# Patient Record
Sex: Female | Born: 1960 | Race: White | Hispanic: No | Marital: Married | Smoking: Never smoker
Health system: Southern US, Community
[De-identification: ages and names within clinical notes are randomized; demographics above are authoritative.]

## PROBLEM LIST (undated history)

## (undated) DIAGNOSIS — R011 Cardiac murmur, unspecified: Secondary | ICD-10-CM

## (undated) DIAGNOSIS — C801 Malignant (primary) neoplasm, unspecified: Secondary | ICD-10-CM

## (undated) DIAGNOSIS — I341 Nonrheumatic mitral (valve) prolapse: Secondary | ICD-10-CM

## (undated) DIAGNOSIS — M1990 Unspecified osteoarthritis, unspecified site: Secondary | ICD-10-CM

## (undated) HISTORY — PX: MOHS SURGERY: SUR867

## (undated) HISTORY — DX: Cardiac murmur, unspecified: R01.1

## (undated) HISTORY — DX: Malignant (primary) neoplasm, unspecified: C80.1

## (undated) HISTORY — PX: COLONOSCOPY: SHX174

## (undated) HISTORY — PX: POLYPECTOMY: SHX149

## (undated) HISTORY — DX: Unspecified osteoarthritis, unspecified site: M19.90

## (undated) HISTORY — DX: Nonrheumatic mitral (valve) prolapse: I34.1

---

## 1983-09-16 HISTORY — PX: OOPHORECTOMY: SHX86

## 1998-07-18 ENCOUNTER — Other Ambulatory Visit: Admission: RE | Admit: 1998-07-18 | Discharge: 1998-07-18 | Payer: Self-pay | Admitting: Obstetrics and Gynecology

## 1998-08-01 ENCOUNTER — Ambulatory Visit (HOSPITAL_COMMUNITY): Admission: RE | Admit: 1998-08-01 | Discharge: 1998-08-01 | Payer: Self-pay | Admitting: Obstetrics and Gynecology

## 1998-09-11 ENCOUNTER — Ambulatory Visit (HOSPITAL_COMMUNITY): Admission: RE | Admit: 1998-09-11 | Discharge: 1998-09-11 | Payer: Self-pay | Admitting: *Deleted

## 1999-08-20 ENCOUNTER — Other Ambulatory Visit: Admission: RE | Admit: 1999-08-20 | Discharge: 1999-08-20 | Payer: Self-pay | Admitting: *Deleted

## 2000-03-04 ENCOUNTER — Encounter (INDEPENDENT_AMBULATORY_CARE_PROVIDER_SITE_OTHER): Payer: Self-pay

## 2000-03-04 ENCOUNTER — Inpatient Hospital Stay (HOSPITAL_COMMUNITY): Admission: AD | Admit: 2000-03-04 | Discharge: 2000-03-06 | Payer: Self-pay | Admitting: *Deleted

## 2000-04-13 ENCOUNTER — Other Ambulatory Visit: Admission: RE | Admit: 2000-04-13 | Discharge: 2000-04-13 | Payer: Self-pay | Admitting: *Deleted

## 2001-05-28 ENCOUNTER — Other Ambulatory Visit: Admission: RE | Admit: 2001-05-28 | Discharge: 2001-05-28 | Payer: Self-pay | Admitting: Obstetrics and Gynecology

## 2001-10-28 ENCOUNTER — Inpatient Hospital Stay (HOSPITAL_COMMUNITY): Admission: AD | Admit: 2001-10-28 | Discharge: 2001-10-28 | Payer: Self-pay | Admitting: Obstetrics and Gynecology

## 2001-10-28 ENCOUNTER — Inpatient Hospital Stay (HOSPITAL_COMMUNITY): Admission: AD | Admit: 2001-10-28 | Discharge: 2001-10-28 | Payer: Self-pay | Admitting: Obstetrics & Gynecology

## 2001-10-29 ENCOUNTER — Inpatient Hospital Stay (HOSPITAL_COMMUNITY): Admission: AD | Admit: 2001-10-29 | Discharge: 2001-10-29 | Payer: Self-pay | Admitting: Obstetrics and Gynecology

## 2001-10-29 ENCOUNTER — Inpatient Hospital Stay (HOSPITAL_COMMUNITY): Admission: AD | Admit: 2001-10-29 | Discharge: 2001-10-29 | Payer: Self-pay | Admitting: Obstetrics & Gynecology

## 2001-12-13 ENCOUNTER — Inpatient Hospital Stay (HOSPITAL_COMMUNITY): Admission: AD | Admit: 2001-12-13 | Discharge: 2001-12-15 | Payer: Self-pay | Admitting: Obstetrics & Gynecology

## 2002-01-14 ENCOUNTER — Other Ambulatory Visit: Admission: RE | Admit: 2002-01-14 | Discharge: 2002-01-14 | Payer: Self-pay | Admitting: Obstetrics and Gynecology

## 2003-02-06 ENCOUNTER — Other Ambulatory Visit: Admission: RE | Admit: 2003-02-06 | Discharge: 2003-02-06 | Payer: Self-pay | Admitting: Obstetrics and Gynecology

## 2003-06-02 ENCOUNTER — Ambulatory Visit (HOSPITAL_COMMUNITY): Admission: RE | Admit: 2003-06-02 | Discharge: 2003-06-02 | Payer: Self-pay | Admitting: Obstetrics and Gynecology

## 2003-06-02 ENCOUNTER — Encounter: Payer: Self-pay | Admitting: Obstetrics and Gynecology

## 2004-02-08 ENCOUNTER — Other Ambulatory Visit: Admission: RE | Admit: 2004-02-08 | Discharge: 2004-02-08 | Payer: Self-pay | Admitting: Obstetrics and Gynecology

## 2004-06-03 ENCOUNTER — Ambulatory Visit (HOSPITAL_COMMUNITY): Admission: RE | Admit: 2004-06-03 | Discharge: 2004-06-03 | Payer: Self-pay | Admitting: Obstetrics and Gynecology

## 2005-06-04 ENCOUNTER — Ambulatory Visit (HOSPITAL_COMMUNITY): Admission: RE | Admit: 2005-06-04 | Discharge: 2005-06-04 | Payer: Self-pay | Admitting: Obstetrics and Gynecology

## 2006-06-12 ENCOUNTER — Ambulatory Visit (HOSPITAL_COMMUNITY): Admission: RE | Admit: 2006-06-12 | Discharge: 2006-06-12 | Payer: Self-pay | Admitting: Obstetrics and Gynecology

## 2007-06-15 ENCOUNTER — Ambulatory Visit (HOSPITAL_COMMUNITY): Admission: RE | Admit: 2007-06-15 | Discharge: 2007-06-15 | Payer: Self-pay | Admitting: Obstetrics and Gynecology

## 2008-06-16 ENCOUNTER — Ambulatory Visit (HOSPITAL_COMMUNITY): Admission: RE | Admit: 2008-06-16 | Discharge: 2008-06-16 | Payer: Self-pay | Admitting: Obstetrics and Gynecology

## 2009-06-20 ENCOUNTER — Ambulatory Visit (HOSPITAL_COMMUNITY): Admission: RE | Admit: 2009-06-20 | Discharge: 2009-06-20 | Payer: Self-pay | Admitting: Obstetrics and Gynecology

## 2010-06-27 ENCOUNTER — Ambulatory Visit (HOSPITAL_COMMUNITY): Admission: RE | Admit: 2010-06-27 | Discharge: 2010-06-27 | Payer: Self-pay | Admitting: Obstetrics and Gynecology

## 2011-01-31 NOTE — Discharge Summary (Signed)
[facility]  Patient:    Wendy Black, Wendy Black                       MRN: [id number] Adm. Date:  [id number] Disc. Date: [id number] Attending:  Ardeen Fillers CC:         Sung Amabile. Roslyn Smiling, M.D. at office                           Discharge Summary  DIAGNOSES:                    1. Intrauterine pregnancy at [redacted] weeks                                  gestational age, delivered.                               2. Rh positive.                               3. Advanced maternal age.                               4. Previous cesarean section.  OPERATIVE PROCEDURE:          Spontaneous vaginal delivery (VBAC); repair of second degree perineal laceration and bilateral labial lacerations; amniotomy and Pitocin augmentation - March 06, 2000.  HISTORY OF PRESENT ILLNESS:   A 50 year old woman, G2 P30, [facility] March 06, 2000 with advanced cervical change and complaints of significant pelvic discomfort, with a new onset of pedal edema.                                The patient is a VBAC candidate.  In 1997, primary low transverse cesarean section performed for the arrest of the second stage of labor after three hours of pushing for a 3900 g female.  Estimated fetal weight two weeks prior to admission was 3173 g.  She was aware of the benefits and risks of vaginal birth versus cesarean section.  HOSPITAL COURSE:              On admission, cervix was 5 cm.  Pedal edema 3+ was noted.  Blood pressure was 143/72.  Normal platelet counts were noted. Pitocin augmentation was initiated.  Amniotomy was performed.  Epidural anesthesia was given during the active phase of labor.  Second stage of 28 minutes.  She was delivered of a live female, 3780 g, with Apgars of 9 and 9, over an intact perineum.  Estimated blood loss was 450 cc.  Second degree perineal laceration and bilateral labial lacerations were repaired without difficulty.  Placenta sent to pathology and later revealed mature  placenta with focal excessive perivillous fibrin deposition.                                The patients postpartum course was unremarkable.  Her hemoglobin stabilized at 11.0.  She chose to breast feed. She was discharged to home on postpartum day #2 in satisfactory condition.  FOLLOW-UP:  She will be followed up by Dr. Lyndon Code in four to six weeks.  INSTRUCTIONS:                 Routine status post vaginal delivery instructions were given.  MEDICATIONS AT DISCHARGE:     Prenatal vitamins and nonsteroidal anti-inflammatory medications. DD:  03/30/00 TD:  03/30/00 Job: [id number]

## 2011-06-11 ENCOUNTER — Other Ambulatory Visit (HOSPITAL_COMMUNITY): Payer: Self-pay | Admitting: Obstetrics and Gynecology

## 2011-06-11 DIAGNOSIS — Z1231 Encounter for screening mammogram for malignant neoplasm of breast: Secondary | ICD-10-CM

## 2011-06-30 ENCOUNTER — Ambulatory Visit (HOSPITAL_COMMUNITY)
Admission: RE | Admit: 2011-06-30 | Discharge: 2011-06-30 | Disposition: A | Discharge status: Home / Self Care | Payer: 59 | Source: Ambulatory Visit | Attending: Obstetrics and Gynecology | Admitting: Obstetrics and Gynecology

## 2011-06-30 DIAGNOSIS — Z1231 Encounter for screening mammogram for malignant neoplasm of breast: Secondary | ICD-10-CM

## 2012-06-16 ENCOUNTER — Other Ambulatory Visit (HOSPITAL_COMMUNITY): Payer: Self-pay | Admitting: Obstetrics and Gynecology

## 2012-06-16 DIAGNOSIS — Z1231 Encounter for screening mammogram for malignant neoplasm of breast: Secondary | ICD-10-CM

## 2012-07-06 ENCOUNTER — Ambulatory Visit (HOSPITAL_COMMUNITY)
Admission: RE | Admit: 2012-07-06 | Discharge: 2012-07-06 | Disposition: A | Discharge status: Home / Self Care | Payer: 59 | Source: Ambulatory Visit | Attending: Obstetrics and Gynecology | Admitting: Obstetrics and Gynecology

## 2012-07-06 DIAGNOSIS — Z1231 Encounter for screening mammogram for malignant neoplasm of breast: Secondary | ICD-10-CM | POA: Insufficient documentation

## 2013-05-30 ENCOUNTER — Other Ambulatory Visit (HOSPITAL_COMMUNITY): Payer: Self-pay | Admitting: Obstetrics and Gynecology

## 2013-05-30 DIAGNOSIS — Z1231 Encounter for screening mammogram for malignant neoplasm of breast: Secondary | ICD-10-CM

## 2013-06-17 ENCOUNTER — Encounter: Payer: Self-pay | Admitting: Internal Medicine

## 2013-07-07 ENCOUNTER — Ambulatory Visit (HOSPITAL_COMMUNITY)
Admission: RE | Admit: 2013-07-07 | Discharge: 2013-07-07 | Disposition: A | Discharge status: Home / Self Care | Payer: 59 | Source: Ambulatory Visit | Attending: Obstetrics and Gynecology | Admitting: Obstetrics and Gynecology

## 2013-07-07 DIAGNOSIS — Z1231 Encounter for screening mammogram for malignant neoplasm of breast: Secondary | ICD-10-CM | POA: Insufficient documentation

## 2013-08-19 ENCOUNTER — Ambulatory Visit (AMBULATORY_SURGERY_CENTER): Payer: Self-pay

## 2013-08-19 VITALS — Ht 64.5 in | Wt 153.0 lb

## 2013-08-19 DIAGNOSIS — Z860100 Personal history of colon polyps, unspecified: Secondary | ICD-10-CM

## 2013-08-19 MED ORDER — MOVIPREP 100 G PO SOLR: 1.0000 | Freq: Once | ORAL | Status: DC

## 2013-08-22 ENCOUNTER — Encounter: Payer: Self-pay | Admitting: Internal Medicine

## 2013-09-02 ENCOUNTER — Ambulatory Visit (AMBULATORY_SURGERY_CENTER): Payer: 59 | Admitting: Internal Medicine

## 2013-09-02 ENCOUNTER — Encounter: Payer: Self-pay | Admitting: Internal Medicine

## 2013-09-02 VITALS — BP 131/67 | HR 72 | Temp 97.7°F | Resp 19 | Ht 64.5 in | Wt 153.0 lb

## 2013-09-02 DIAGNOSIS — Z860100 Personal history of colon polyps, unspecified: Secondary | ICD-10-CM

## 2013-09-02 DIAGNOSIS — D126 Benign neoplasm of colon, unspecified: Secondary | ICD-10-CM

## 2013-09-02 DIAGNOSIS — Z1211 Encounter for screening for malignant neoplasm of colon: Secondary | ICD-10-CM

## 2013-09-02 MED ORDER — SODIUM CHLORIDE 0.9 % IV SOLN: 500.0000 mL | INTRAVENOUS | Status: DC

## 2013-09-02 NOTE — Op Note (Signed)
[facility] [address] [place], [address]   COLONOSCOPY PROCEDURE REPORT  PATIENT: Wendy Black, Wendy Black  MR#: [id number] BIRTHDATE: 19-May-1961 , 52  yrs. old GENDER: Female ENDOSCOPIST: Hart Carwin, MD REFERRED XB:JYNW [place], [place] PROCEDURE DATE:  09/02/2013 PROCEDURE:   Colonoscopy with snare polypectomy First Screening Colonoscopy - Avg.  risk and is 50 yrs.  old or older Yes.  Prior Negative Screening - Now for repeat screening. N/A  History of Adenoma - Now for follow-up colonoscopy & has been > or = to 3 yrs.  N/A  Polyps Removed Today? Yes. ASA CLASS:   Class I INDICATIONS:Average risk patient for colon cancer. MEDICATIONS: MAC sedation, administered by CRNA and Propofol (Diprivan) 450 mg IV  DESCRIPTION OF PROCEDURE:   After the risks benefits and alternatives of the procedure were thoroughly explained, informed consent was obtained.  A digital rectal exam revealed no abnormalities of the rectum.   The LB 1528  endoscope was introduced through the anus and advanced to the cecum, which was identified by both the appendix and ileocecal valve. No adverse events experienced.   The quality of the prep was Moviprep fair The instrument was then slowly withdrawn as the colon was fully examined.      COLON FINDINGS: A polypoid shaped sessile polyp ranging between 3-43mm in size was found at the cecum.  A polypectomy was performed with a cold snare.  The resection was complete and the polyp tissue was completely retrieved.  Retroflexed views revealed no abnormalities. The time to cecum=10 minutes 51 seconds.  Withdrawal time=12 minutes 55 seconds.  The scope was withdrawn and the procedure completed. COMPLICATIONS: There were no complications.  ENDOSCOPIC IMPRESSION: Sessile polyp ranging between 3-83mm in size was found at the cecum; polypectomy was performed with a cold snare  RECOMMENDATIONS: 1.  Await pathology results 2.  recall colonoscopy pending  pathology 3.high fiber diet  eSigned:  Hart Carwin, MD 09/02/2013 9:13 AM   cc:

## 2013-09-02 NOTE — Patient Instructions (Signed)
Reviewed discharge instructions.  Information sheet given on polyps and high fiber diet.  Reviewed discharge instructions with YOU HAD AN ENDOSCOPIC PROCEDURE TODAY AT THE [facility]: Refer to the procedure report that was given to you for any specific questions about what was found during the examination.  If the procedure report does not answer your questions, please call your gastroenterologist to clarify.  If you requested that your care partner not be given the details of your procedure findings, then the procedure report has been included in a sealed envelope for you to review at your convenience later.  YOU SHOULD EXPECT: Some feelings of bloating in the abdomen. Passage of more gas than usual.  Walking can help get rid of the air that was put into your GI tract during the procedure and reduce the bloating. If you had a lower endoscopy (such as a colonoscopy or flexible sigmoidoscopy) you may notice spotting of blood in your stool or on the toilet paper. If you underwent a bowel prep for your procedure, then you may not have a normal bowel movement for a few days.  DIET: Your first meal following the procedure should be a light meal and then it is ok to progress to your normal diet.  A half-sandwich or bowl of soup is an example of a good first meal.  Heavy or fried foods are harder to digest and may make you feel nauseous or bloated.  Likewise meals heavy in dairy and vegetables can cause extra gas to form and this can also increase the bloating.  Drink plenty of fluids but you should avoid alcoholic beverages for 24 hours.  ACTIVITY: Your care partner should take you home directly after the procedure.  You should plan to take it easy, moving slowly for the rest of the day.  You can resume normal activity the day after the procedure however you should NOT DRIVE or use heavy machinery for 24 hours (because of the sedation medicines used during the test).    SYMPTOMS TO REPORT  IMMEDIATELY: A gastroenterologist can be reached at any hour.  During normal business hours, 8:30 AM to 5:00 PM Monday through Friday, call [phone].  After hours and on weekends, please call the GI answering service at [phone] who will take a message and have the physician on call contact you.   Following lower endoscopy (colonoscopy or flexible sigmoidoscopy):  Excessive amounts of blood in the stool  Significant tenderness or worsening of abdominal pains  Swelling of the abdomen that is new, acute  Fever of 100F or higher  FOLLOW UP: If any biopsies were taken you will be contacted by phone or by letter within the next 1-3 weeks.  Call your gastroenterologist if you have not heard about the biopsies in 3 weeks.  Our staff will call the home number listed on your records the next business day following your procedure to check on you and address any questions or concerns that you may have at that time regarding the information given to you following your procedure. This is a courtesy call and so if there is no answer at the home number and we have not heard from you through the emergency physician on call, we will assume that you have returned to your regular daily activities without incident.  SIGNATURES/CONFIDENTIALITY: You and/or your care partner have signed paperwork which will be entered into your electronic medical record.  These signatures attest to the fact that that the information above on  your After Visit Summary has been reviewed and is understood.  Full responsibility of the confidentiality of this discharge information lies with you and/or your care-partner. 

## 2013-09-02 NOTE — Progress Notes (Signed)
Called to room to assist during endoscopic procedure.  Patient ID and intended procedure confirmed with present staff. Received instructions for my participation in the procedure from the performing physician.  

## 2013-09-02 NOTE — Progress Notes (Signed)
Report to pacu rn, vss, bbs=clear 

## 2013-09-05 ENCOUNTER — Telehealth: Payer: Self-pay | Admitting: *Deleted

## 2013-09-05 NOTE — Telephone Encounter (Signed)
  Follow up Call-  Call back number 09/02/2013  Post procedure Call Back phone  # [phone]  Permission to leave phone message Yes     Patient questions:  Do you have a fever, pain , or abdominal swelling? no Pain Score  0 *  Have you tolerated food without any problems? yes  Have you been able to return to your normal activities? yes  Do you have any questions about your discharge instructions: Diet   no Medications  no Follow up visit  no  Do you have questions or concerns about your Care? no  Actions: * If pain score is 4 or above: No action needed, pain <4.

## 2013-09-06 ENCOUNTER — Encounter: Payer: Self-pay | Admitting: Internal Medicine

## 2014-06-05 ENCOUNTER — Other Ambulatory Visit (HOSPITAL_COMMUNITY): Payer: Self-pay | Admitting: Obstetrics and Gynecology

## 2014-06-05 DIAGNOSIS — Z1231 Encounter for screening mammogram for malignant neoplasm of breast: Secondary | ICD-10-CM

## 2014-06-30 ENCOUNTER — Other Ambulatory Visit: Payer: Self-pay | Admitting: Dermatology

## 2014-07-11 ENCOUNTER — Ambulatory Visit (HOSPITAL_COMMUNITY)
Admission: RE | Admit: 2014-07-11 | Discharge: 2014-07-11 | Disposition: A | Discharge status: Home / Self Care | Payer: 59 | Source: Ambulatory Visit | Attending: Obstetrics and Gynecology | Admitting: Obstetrics and Gynecology

## 2014-07-11 DIAGNOSIS — Z1231 Encounter for screening mammogram for malignant neoplasm of breast: Secondary | ICD-10-CM | POA: Insufficient documentation

## 2014-10-11 ENCOUNTER — Other Ambulatory Visit: Payer: Self-pay | Admitting: Dermatology

## 2015-06-12 ENCOUNTER — Other Ambulatory Visit: Payer: Self-pay

## 2015-06-12 DIAGNOSIS — Z1231 Encounter for screening mammogram for malignant neoplasm of breast: Secondary | ICD-10-CM

## 2015-07-13 ENCOUNTER — Ambulatory Visit
Admission: RE | Admit: 2015-07-13 | Discharge: 2015-07-13 | Disposition: A | Discharge status: Home / Self Care | Payer: 59 | Source: Ambulatory Visit

## 2015-07-13 DIAGNOSIS — Z1231 Encounter for screening mammogram for malignant neoplasm of breast: Secondary | ICD-10-CM

## 2016-06-25 ENCOUNTER — Other Ambulatory Visit: Payer: Self-pay | Admitting: Obstetrics and Gynecology

## 2016-06-25 DIAGNOSIS — Z1231 Encounter for screening mammogram for malignant neoplasm of breast: Secondary | ICD-10-CM

## 2016-07-15 ENCOUNTER — Ambulatory Visit
Admission: RE | Admit: 2016-07-15 | Discharge: 2016-07-15 | Disposition: A | Discharge status: Home / Self Care | Payer: BLUE CROSS/BLUE SHIELD | Source: Ambulatory Visit | Attending: Obstetrics and Gynecology | Admitting: Obstetrics and Gynecology

## 2016-07-15 DIAGNOSIS — Z1231 Encounter for screening mammogram for malignant neoplasm of breast: Secondary | ICD-10-CM

## 2017-06-04 ENCOUNTER — Other Ambulatory Visit: Payer: Self-pay | Admitting: Obstetrics and Gynecology

## 2017-06-04 DIAGNOSIS — Z1231 Encounter for screening mammogram for malignant neoplasm of breast: Secondary | ICD-10-CM

## 2017-07-20 ENCOUNTER — Ambulatory Visit
Admission: RE | Admit: 2017-07-20 | Discharge: 2017-07-20 | Disposition: A | Discharge status: Home / Self Care | Payer: BLUE CROSS/BLUE SHIELD | Source: Ambulatory Visit | Attending: Obstetrics and Gynecology | Admitting: Obstetrics and Gynecology

## 2017-07-20 DIAGNOSIS — Z1231 Encounter for screening mammogram for malignant neoplasm of breast: Secondary | ICD-10-CM

## 2018-06-01 ENCOUNTER — Telehealth: Payer: Self-pay | Admitting: Internal Medicine

## 2018-06-01 NOTE — Telephone Encounter (Signed)
I spoke to Wendy Black's nurse  Earlier this am Evelina Bucy needs appt to see him to dicuss lipids    She said she would acall

## 2018-06-01 NOTE — Telephone Encounter (Signed)
New Message:     Patient states she is a close friend with Dr. Harrington Challenger and  is returning a call for a referral about son Chester Romero

## 2018-06-03 NOTE — Telephone Encounter (Signed)
Dr. Harrington Challenger aware of patient's son's appt with Dr. Debara Pickett.

## 2018-06-28 ENCOUNTER — Other Ambulatory Visit: Payer: Self-pay | Admitting: Obstetrics and Gynecology

## 2018-06-28 DIAGNOSIS — Z1231 Encounter for screening mammogram for malignant neoplasm of breast: Secondary | ICD-10-CM

## 2018-07-23 ENCOUNTER — Encounter: Payer: Self-pay | Admitting: Gastroenterology

## 2018-08-02 ENCOUNTER — Ambulatory Visit
Admission: RE | Admit: 2018-08-02 | Discharge: 2018-08-02 | Disposition: A | Discharge status: Home / Self Care | Payer: BC Managed Care – PPO | Source: Ambulatory Visit | Attending: Obstetrics and Gynecology | Admitting: Obstetrics and Gynecology

## 2018-08-02 DIAGNOSIS — Z1231 Encounter for screening mammogram for malignant neoplasm of breast: Secondary | ICD-10-CM

## 2018-08-30 ENCOUNTER — Encounter: Payer: Self-pay | Admitting: Gastroenterology

## 2018-08-30 ENCOUNTER — Ambulatory Visit (AMBULATORY_SURGERY_CENTER): Payer: Self-pay | Admitting: *Deleted

## 2018-08-30 VITALS — Ht 64.5 in | Wt 155.0 lb

## 2018-08-30 DIAGNOSIS — Z86010 Personal history of colonic polyps: Secondary | ICD-10-CM

## 2018-08-30 MED ORDER — SOD PICOSULFATE-MAG OX-CIT ACD 10-3.5-12 MG-GM -GM/160ML PO SOLN: 1.0000 | ORAL | 0 refills | Status: DC

## 2018-08-30 NOTE — Progress Notes (Signed)
Patient denies any allergies to eggs or soy. Patient denies any problems with anesthesia/sedation. Patient denies any oxygen use at home. Patient denies taking any diet/weight loss medications or blood thinners. EMMI education offered, pt declined. Patient was unable to drink all the Moviprep last colon. We discussed her prep options including Miralax, she picked Clenpiq because of the low volume. Clenpiq free coupon given to pt.

## 2018-09-13 ENCOUNTER — Encounter: Payer: Self-pay | Admitting: Gastroenterology

## 2018-09-13 ENCOUNTER — Ambulatory Visit (AMBULATORY_SURGERY_CENTER): Payer: BC Managed Care – PPO | Admitting: Gastroenterology

## 2018-09-13 VITALS — BP 121/62 | HR 75 | Temp 99.5°F | Resp 21 | Ht 64.5 in | Wt 155.0 lb

## 2018-09-13 DIAGNOSIS — D124 Benign neoplasm of descending colon: Secondary | ICD-10-CM | POA: Diagnosis not present

## 2018-09-13 DIAGNOSIS — Z86010 Personal history of colonic polyps: Secondary | ICD-10-CM | POA: Diagnosis present

## 2018-09-13 DIAGNOSIS — D120 Benign neoplasm of cecum: Secondary | ICD-10-CM

## 2018-09-13 MED ORDER — SODIUM CHLORIDE 0.9 % IV SOLN: 500.0000 mL | Freq: Once | INTRAVENOUS | Status: DC

## 2018-09-13 NOTE — Progress Notes (Signed)
Called to room to assist during endoscopic procedure.  Patient ID and intended procedure confirmed with present staff. Received instructions for my participation in the procedure from the performing physician.  

## 2018-09-13 NOTE — Progress Notes (Signed)
A/ox3, pleased with MAC, report to RN 

## 2018-09-13 NOTE — Patient Instructions (Signed)
YOU HAD AN ENDOSCOPIC PROCEDURE TODAY AT THE [facility]:   Refer to the procedure report that was given to you for any specific questions about what was found during the examination.  If the procedure report does not answer your questions, please call your gastroenterologist to clarify.  If you requested that your care partner not be given the details of your procedure findings, then the procedure report has been included in a sealed envelope for you to review at your convenience later.  YOU SHOULD EXPECT: Some feelings of bloating in the abdomen. Passage of more gas than usual.  Walking can help get rid of the air that was put into your GI tract during the procedure and reduce the bloating. If you had a lower endoscopy (such as a colonoscopy or flexible sigmoidoscopy) you may notice spotting of blood in your stool or on the toilet paper. If you underwent a bowel prep for your procedure, you may not have a normal bowel movement for a few days.  Please Note:  You might notice some irritation and congestion in your nose or some drainage.  This is from the oxygen used during your procedure.  There is no need for concern and it should clear up in a day or so.  SYMPTOMS TO REPORT IMMEDIATELY:   Following lower endoscopy (colonoscopy or flexible sigmoidoscopy):  Excessive amounts of blood in the stool  Significant tenderness or worsening of abdominal pains  Swelling of the abdomen that is new, acute  Fever of 100F or higher   For urgent or emergent issues, a gastroenterologist can be reached at any hour by calling [phone].   DIET:  We do recommend a small meal at first, but then you may proceed to your regular diet.  Drink plenty of fluids but you should avoid alcoholic beverages for 24 hours.  ACTIVITY:  You should plan to take it easy for the rest of today and you should NOT DRIVE or use heavy machinery until tomorrow (because of the sedation medicines used during the test).     FOLLOW UP: Our staff will call the number listed on your records the next business day following your procedure to check on you and address any questions or concerns that you may have regarding the information given to you following your procedure. If we do not reach you, we will leave a message.  However, if you are feeling well and you are not experiencing any problems, there is no need to return our call.  We will assume that you have returned to your regular daily activities without incident.  If any biopsies were taken you will be contacted by phone or by letter within the next 1-3 weeks.  Please call us at [phone] if you have not heard about the biopsies in 3 weeks.    SIGNATURES/CONFIDENTIALITY: You and/or your care partner have signed paperwork which will be entered into your electronic medical record.  These signatures attest to the fact that that the information above on your After Visit Summary has been reviewed and is understood.  Full responsibility of the confidentiality of this discharge information lies with you and/or your care-partner.   Handout was given to your care partner on polyps. You may resume your current medications today. Await biopsy results. Please call if any questions or concerns.   

## 2018-09-13 NOTE — Op Note (Signed)
Wendy Black Patient Name: Wendy Black Procedure Date: 09/13/2018 8:17 AM MRN: [id number] Endoscopist: Thornton Park MD, MD Age: 57 Referring MD:  Date of Birth: [birth year] Gender: Female Account #: [id number] Procedure:                Colonoscopy Indications:              Surveillance: Personal history of adenomatous                            polyps on last colonoscopy 5 years ago (cecal                            tubular adenoma removed in colonoscopy in 2014). No                            known family history of colon cancer or polyps. No                            baseline GI symptoms. Medicines:                See the Anesthesia note for documentation of the                            administered medications Procedure:                Pre-Anesthesia Assessment:                           - Prior to the procedure, a History and Physical                            was performed, and patient medications and                            allergies were reviewed. The patient's tolerance of                            previous anesthesia was also reviewed. The risks                            and benefits of the procedure and the sedation                            options and risks were discussed with the patient.                            All questions were answered, and informed consent                            was obtained. Prior Anticoagulants: The patient has                            taken no previous anticoagulant or antiplatelet  agents. ASA Grade Assessment: II - A patient with                            mild systemic disease. After reviewing the risks                            and benefits, the patient was deemed in                            satisfactory condition to undergo the procedure.                           After obtaining informed consent, the colonoscope                            was passed under direct vision.  Throughout the                            procedure, the patient's blood pressure, pulse, and                            oxygen saturations were monitored continuously. The                            Model CF-HQ190L [id number]) scope was introduced                            through the anus and advanced to the the terminal                            ileum, with identification of the appendiceal                            orifice and IC valve. The colonoscopy was performed                            without difficulty. There was some looping in the                            sigmoid colon. The patient tolerated the procedure                            well. The quality of the bowel preparation was good. Scope In: 8:28:16 AM Scope Out: 8:42:15 AM Scope Withdrawal Time: 0 hours 8 minutes 2 seconds  Total Procedure Duration: 0 hours 13 minutes 59 seconds  Findings:                 The perianal and digital rectal examinations were                            normal.                           A 4 mm polyp was found in the  proximal descending                            colon. The polyp was sessile. The polyp was removed                            with a cold snare. Resection and retrieval were                            complete. Estimated blood loss was minimal.                           A 3 mm polyp was found in the cecum. The polyp was                            sessile. The polyp was removed with a cold snare.                            Resection and retrieval were complete. Estimated                            blood loss was minimal.                           The exam was otherwise without abnormality on                            direct and retroflexion views. Complications:            No immediate complications. Estimated blood loss:                            Minimal. Estimated Blood Loss:     Estimated blood loss was minimal. Impression:               - One 4 mm polyp in the proximal  descending colon,                            removed with a cold snare. Resected and retrieved.                           - One 3 mm polyp in the cecum, removed with a cold                            snare. Resected and retrieved.                           - The examination was otherwise normal on direct                            and retroflexion views. Recommendation:           - Discharge patient to home.                           -  Resume previous diet today.                           - Continue present medications.                           - Await pathology results.                           - Repeat colonoscopy in 5 years for surveillance                            given the history of polyps regardless of the                            pathology. Thornton Park MD, MD 09/13/2018 8:48:36 AM This report has been signed electronically.

## 2018-09-13 NOTE — Progress Notes (Signed)
No problems noted in the recovery room. maw 

## 2018-09-16 ENCOUNTER — Telehealth: Payer: Self-pay

## 2018-09-16 NOTE — Telephone Encounter (Signed)
  Follow up Call-  Call back number 09/13/2018  Post procedure Call Back phone  # [phone]  Permission to leave phone message Yes  Some recent data might be hidden     Patient questions:  Do you have a fever, pain , or abdominal swelling? No. Pain Score  0 *  Have you tolerated food without any problems? Yes.    Have you been able to return to your normal activities? Yes.    Do you have any questions about your discharge instructions: Diet   No. Medications  No. Follow up visit  No.  Do you have questions or concerns about your Care? No.  Actions: * If pain score is 4 or above: No action needed, pain <4.

## 2018-09-20 ENCOUNTER — Encounter: Payer: Self-pay | Admitting: Gastroenterology

## 2019-07-04 ENCOUNTER — Other Ambulatory Visit: Payer: Self-pay | Admitting: Obstetrics and Gynecology

## 2019-07-04 DIAGNOSIS — Z1231 Encounter for screening mammogram for malignant neoplasm of breast: Secondary | ICD-10-CM

## 2019-08-24 ENCOUNTER — Ambulatory Visit
Admission: RE | Admit: 2019-08-24 | Discharge: 2019-08-24 | Disposition: A | Discharge status: Home / Self Care | Payer: BC Managed Care – PPO | Source: Ambulatory Visit | Attending: Obstetrics and Gynecology | Admitting: Obstetrics and Gynecology

## 2019-08-24 ENCOUNTER — Other Ambulatory Visit: Payer: Self-pay

## 2019-08-24 DIAGNOSIS — Z1231 Encounter for screening mammogram for malignant neoplasm of breast: Secondary | ICD-10-CM

## 2019-11-13 ENCOUNTER — Ambulatory Visit: Payer: BC Managed Care – PPO | Attending: Internal Medicine

## 2019-11-13 DIAGNOSIS — Z23 Encounter for immunization: Secondary | ICD-10-CM

## 2019-11-13 NOTE — Progress Notes (Signed)
   Covid-19 Vaccination Clinic  Name:  Wendy Black    MRN: [id number] DOB: [birth year]  11/13/2019  [name] was observed post Covid-19 immunization for 15 minutes without incidence. She was provided with Vaccine Information Sheet and instruction to access the V-Safe system.   [name] was instructed to call 911 with any severe reactions post vaccine: Marland Kitchen Difficulty breathing  . Swelling of your face and throat  . A fast heartbeat  . A bad rash all over your body  . Dizziness and weakness    Immunizations Administered    Name Date Dose VIS Date Route   Pfizer COVID-19 Vaccine 11/13/2019  3:34 PM 0.3 mL 08/26/2019 Intramuscular   Manufacturer: [place]   Lot: [id number]   [place]: [id number]

## 2019-11-16 IMAGING — MG DIGITAL SCREENING BILATERAL MAMMOGRAM WITH TOMO AND CAD
8 series · 9 of 24 positions shown · non-contrast
Comparison: Previous exam(s).

CLINICAL DATA: Screening.

EXAM:
DIGITAL SCREENING BILATERAL MAMMOGRAM WITH TOMO AND CAD

[L CC synth-2D]
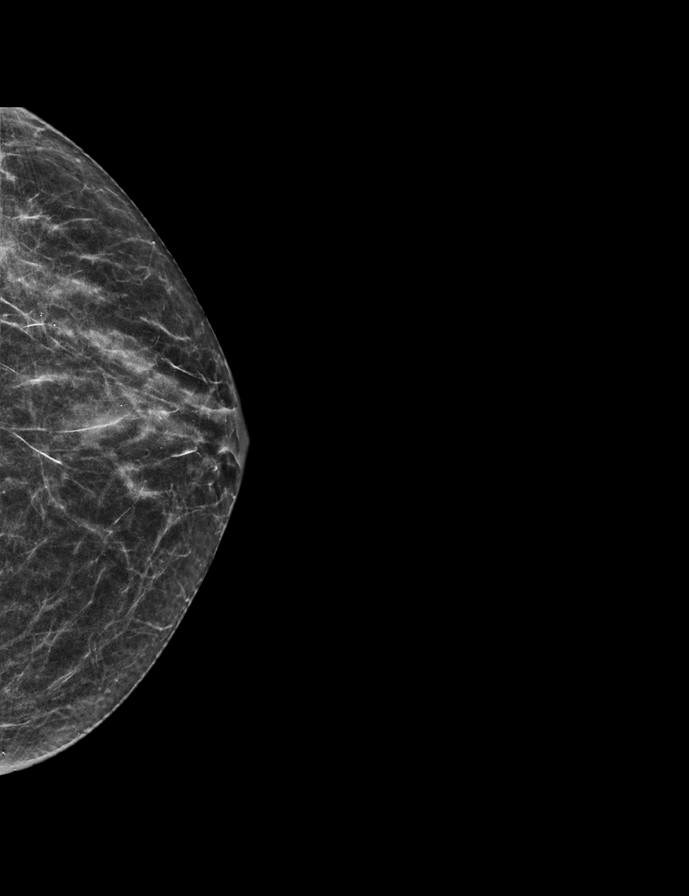

[R MLO synth-2D]
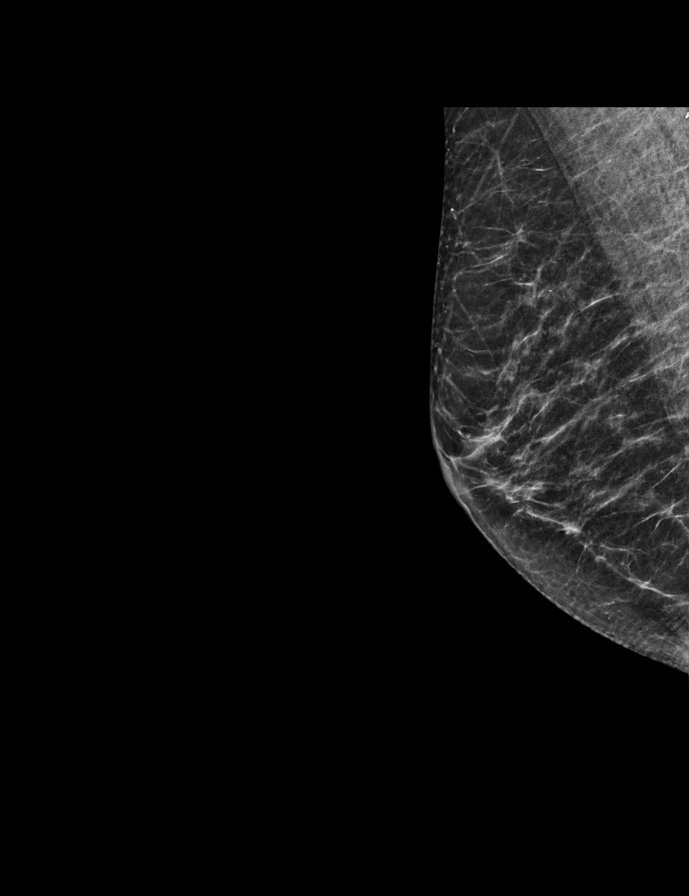

[L MLO synth-2D]
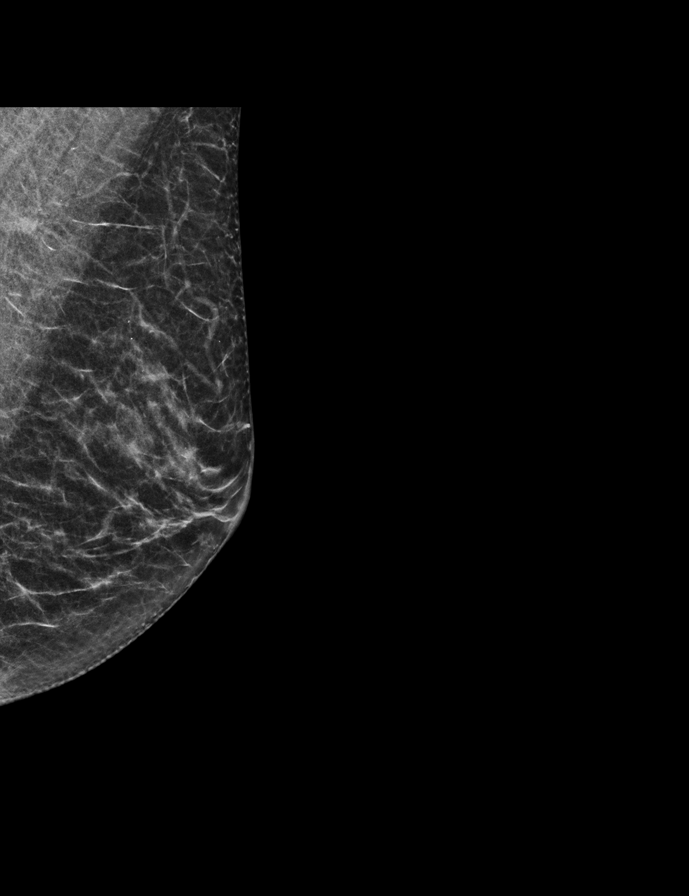

[R CC synth-2D]
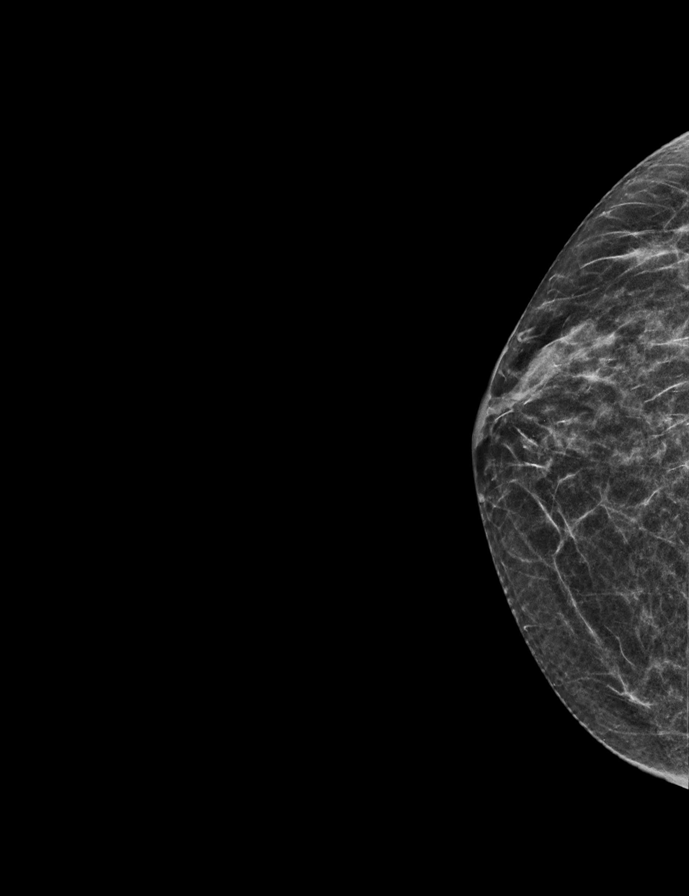

[R MLO tomo · 2 of 48 frames shown]
[frame 16/48]
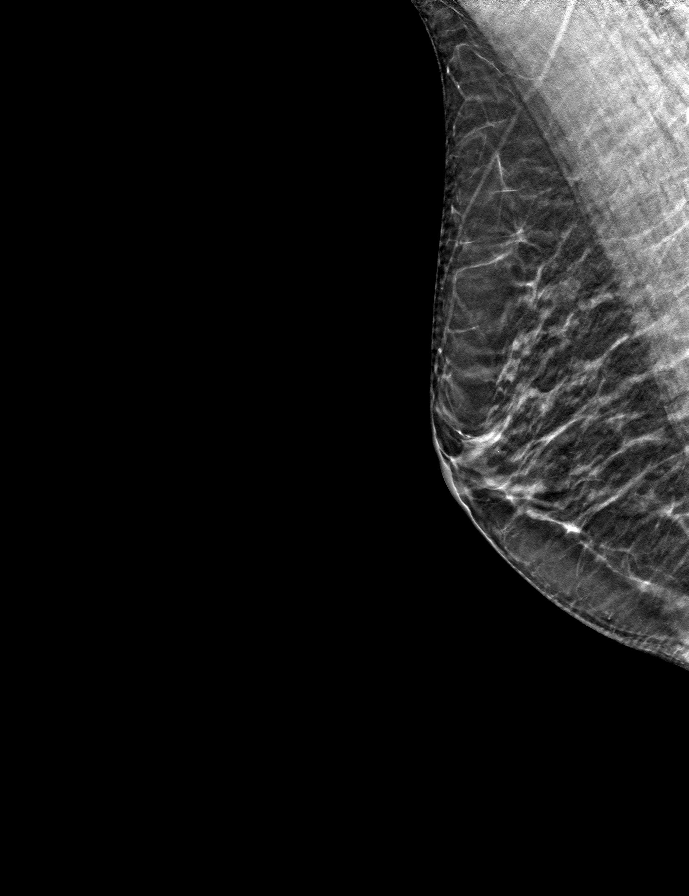
[frame 25/48]
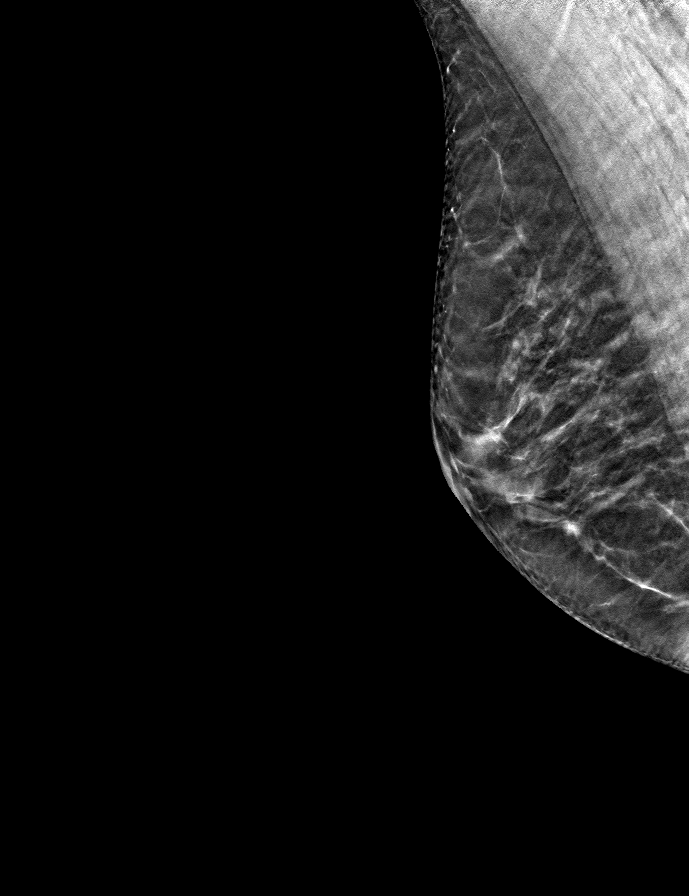

[L CC tomo · tomo slice 25/48.0]
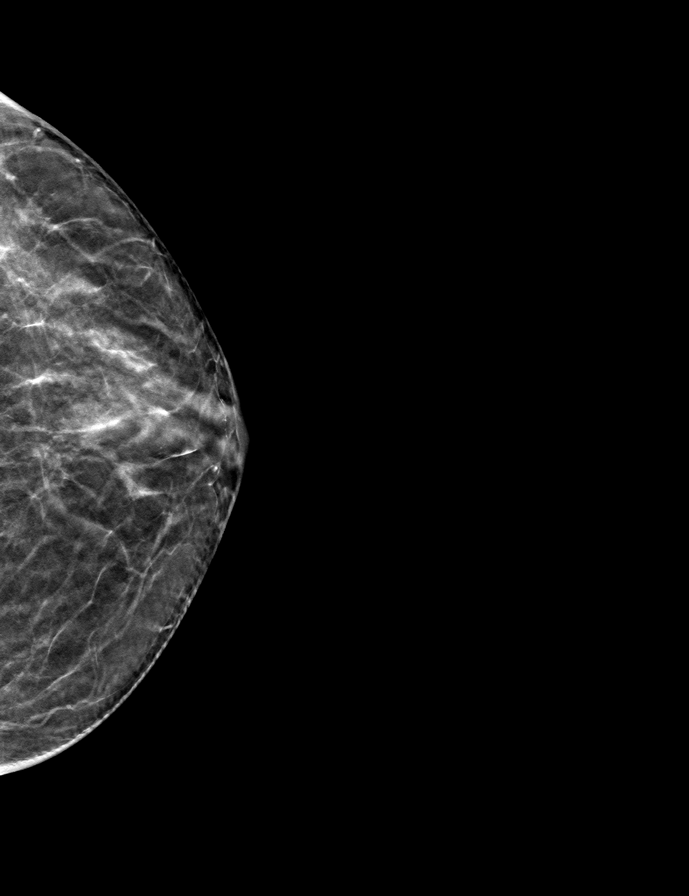

[R CC tomo · tomo slice 23/45.0]
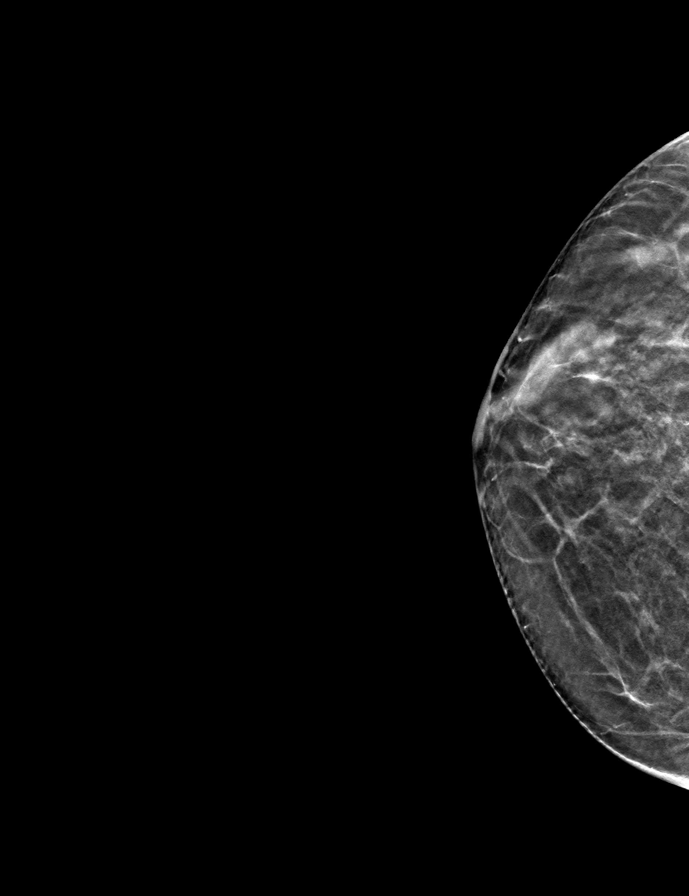

[L MLO tomo · tomo slice 25/48.0]
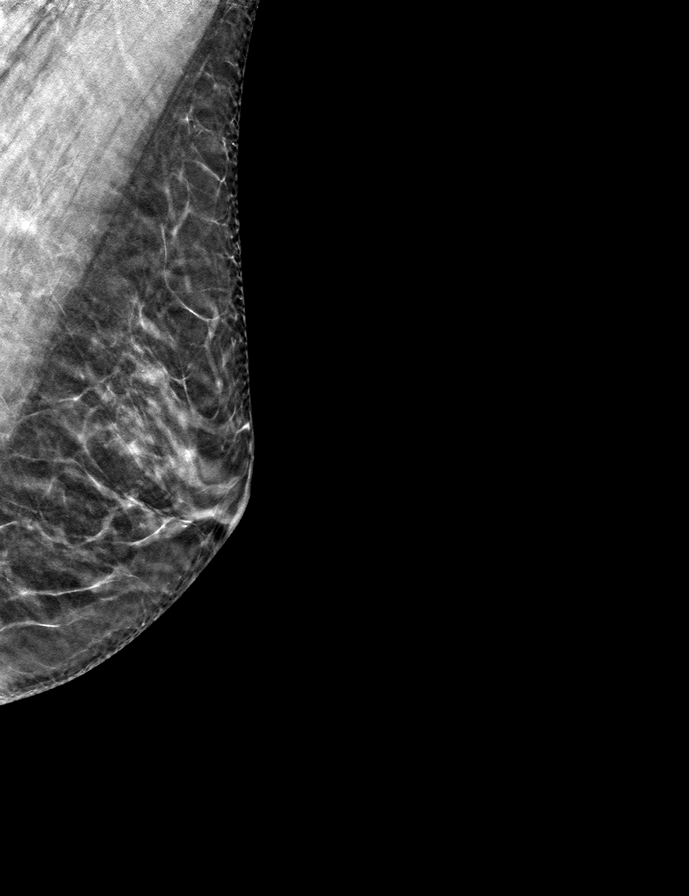

[9 of 24 positions shown; findings below may reference images not displayed]

ACR Breast Density Category b: There are scattered areas of
fibroglandular density.
FINDINGS: There are no findings suspicious for malignancy. Images were
processed with CAD.
IMPRESSION: No mammographic evidence of malignancy. A result letter of this
screening mammogram will be mailed directly to the patient.

RECOMMENDATION:
Screening mammogram in one year. (Code:CN-U-775)

BI-RADS CATEGORY  1: Negative.

## 2019-12-03 ENCOUNTER — Ambulatory Visit: Payer: BC Managed Care – PPO | Attending: Internal Medicine

## 2019-12-03 DIAGNOSIS — Z23 Encounter for immunization: Secondary | ICD-10-CM

## 2019-12-03 NOTE — Progress Notes (Signed)
   Covid-19 Vaccination Clinic  Name:  Wendy Black    MRN: [id number] DOB: 08/24/1961  12/03/2019  [name] was observed post Covid-19 immunization for 15 minutes without incident. She was provided with Vaccine Information Sheet and instruction to access the V-Safe system.   [name] was instructed to call 911 with any severe reactions post vaccine: Marland Kitchen Difficulty breathing  . Swelling of face and throat  . A fast heartbeat  . A bad rash all over body  . Dizziness and weakness   Immunizations Administered    Name Date Dose VIS Date Route   Pfizer COVID-19 Vaccine 12/03/2019 10:08 AM 0.3 mL 08/26/2019 Intramuscular   Manufacturer: [place]   Lot: [id number]   [place]: [id number]

## 2020-08-23 ENCOUNTER — Other Ambulatory Visit: Payer: Self-pay | Admitting: Obstetrics and Gynecology

## 2020-08-23 DIAGNOSIS — Z1231 Encounter for screening mammogram for malignant neoplasm of breast: Secondary | ICD-10-CM

## 2020-10-04 ENCOUNTER — Other Ambulatory Visit: Payer: Self-pay

## 2020-10-04 ENCOUNTER — Ambulatory Visit
Admission: RE | Admit: 2020-10-04 | Discharge: 2020-10-04 | Disposition: A | Discharge status: Home / Self Care | Payer: BC Managed Care – PPO | Source: Ambulatory Visit | Attending: Obstetrics and Gynecology | Admitting: Obstetrics and Gynecology

## 2020-10-04 DIAGNOSIS — Z1231 Encounter for screening mammogram for malignant neoplasm of breast: Secondary | ICD-10-CM

## 2021-09-24 ENCOUNTER — Other Ambulatory Visit: Payer: Self-pay | Admitting: Internal Medicine

## 2021-09-24 DIAGNOSIS — Z1231 Encounter for screening mammogram for malignant neoplasm of breast: Secondary | ICD-10-CM

## 2021-10-07 ENCOUNTER — Ambulatory Visit
Admission: RE | Admit: 2021-10-07 | Discharge: 2021-10-07 | Disposition: A | Discharge status: Home / Self Care | Payer: BC Managed Care – PPO | Source: Ambulatory Visit

## 2021-10-07 DIAGNOSIS — Z1231 Encounter for screening mammogram for malignant neoplasm of breast: Secondary | ICD-10-CM

## 2022-10-06 ENCOUNTER — Other Ambulatory Visit: Payer: Self-pay | Admitting: Internal Medicine

## 2022-10-06 DIAGNOSIS — Z1231 Encounter for screening mammogram for malignant neoplasm of breast: Secondary | ICD-10-CM

## 2022-11-24 ENCOUNTER — Ambulatory Visit
Admission: RE | Admit: 2022-11-24 | Discharge: 2022-11-24 | Disposition: A | Discharge status: Home / Self Care | Payer: BC Managed Care – PPO | Source: Ambulatory Visit | Attending: Internal Medicine | Admitting: Internal Medicine

## 2022-11-24 DIAGNOSIS — Z1231 Encounter for screening mammogram for malignant neoplasm of breast: Secondary | ICD-10-CM

## 2023-06-04 ENCOUNTER — Encounter: Payer: Self-pay | Admitting: Internal Medicine

## 2023-08-04 ENCOUNTER — Ambulatory Visit (AMBULATORY_SURGERY_CENTER): Payer: BC Managed Care – PPO

## 2023-08-04 ENCOUNTER — Encounter: Payer: Self-pay | Admitting: Internal Medicine

## 2023-08-04 VITALS — Ht 64.0 in | Wt 145.0 lb

## 2023-08-04 DIAGNOSIS — Z860100 Personal history of colon polyps, unspecified: Secondary | ICD-10-CM

## 2023-08-04 MED ORDER — ONDANSETRON HCL 4 MG PO TABS: 4.0000 mg | ORAL_TABLET | ORAL | 0 refills | Status: DC

## 2023-08-04 MED ORDER — NA SULFATE-K SULFATE-MG SULF 17.5-3.13-1.6 GM/177ML PO SOLN: 1.0000 | Freq: Once | ORAL | 0 refills | Status: AC

## 2023-08-04 NOTE — Progress Notes (Signed)

## 2023-08-26 ENCOUNTER — Encounter: Payer: Self-pay | Admitting: Internal Medicine

## 2023-08-26 ENCOUNTER — Ambulatory Visit: Payer: BC Managed Care – PPO | Admitting: Internal Medicine

## 2023-08-26 VITALS — BP 131/51 | HR 74 | Temp 98.2°F | Resp 12 | Ht 64.0 in | Wt 145.0 lb

## 2023-08-26 DIAGNOSIS — D120 Benign neoplasm of cecum: Secondary | ICD-10-CM

## 2023-08-26 DIAGNOSIS — Z1211 Encounter for screening for malignant neoplasm of colon: Secondary | ICD-10-CM | POA: Diagnosis present

## 2023-08-26 DIAGNOSIS — Z860100 Personal history of colon polyps, unspecified: Secondary | ICD-10-CM

## 2023-08-26 DIAGNOSIS — K635 Polyp of colon: Secondary | ICD-10-CM | POA: Diagnosis not present

## 2023-08-26 DIAGNOSIS — D124 Benign neoplasm of descending colon: Secondary | ICD-10-CM

## 2023-08-26 MED ORDER — SODIUM CHLORIDE 0.9 % IV SOLN: 500.0000 mL | Freq: Once | INTRAVENOUS | Status: DC

## 2023-08-26 NOTE — Progress Notes (Signed)
Pt's states no medical or surgical changes since previsit or office visit. 

## 2023-08-26 NOTE — Patient Instructions (Signed)
No MRI's for 1 month due to the metal clip in  your colon.  You may resume all of your previous medicines today as ordered.  Read your discharge instructions.  YOU HAD AN ENDOSCOPIC PROCEDURE TODAY AT THE [facility]:   Refer to the procedure report that was given to you for any specific questions about what was found during the examination.  If the procedure report does not answer your questions, please call your gastroenterologist to clarify.  If you requested that your care partner not be given the details of your procedure findings, then the procedure report has been included in a sealed envelope for you to review at your convenience later.  YOU SHOULD EXPECT: Some feelings of bloating in the abdomen. Passage of more gas than usual.  Walking can help get rid of the air that was put into your GI tract during the procedure and reduce the bloating. If you had a lower endoscopy (such as a colonoscopy or flexible sigmoidoscopy) you may notice spotting of blood in your stool or on the toilet paper. If you underwent a bowel prep for your procedure, you may not have a normal bowel movement for a few days.  Please Note:  You might notice some irritation and congestion in your nose or some drainage.  This is from the oxygen used during your procedure.  There is no need for concern and it should clear up in a day or so.  SYMPTOMS TO REPORT IMMEDIATELY:  Following lower endoscopy (colonoscopy or flexible sigmoidoscopy):  Excessive amounts of blood in the stool  Significant tenderness or worsening of abdominal pains  Swelling of the abdomen that is new, acute  Fever of 100F or higher   For urgent or emergent issues, a gastroenterologist can be reached at any hour by calling [phone]. Do not use MyChart messaging for urgent concerns.    DIET:  We do recommend a small meal at first, but then you may proceed to your regular diet.  Drink plenty of fluids but you should avoid alcoholic  beverages for 24 hours.  ACTIVITY:  You should plan to take it easy for the rest of today and you should NOT DRIVE or use heavy machinery until tomorrow (because of the sedation medicines used during the test).    FOLLOW UP: Our staff will call the number listed on your records the next business day following your procedure.  We will call around 7:15- 8:00 am to check on you and address any questions or concerns that you may have regarding the information given to you following your procedure. If we do not reach you, we will leave a message.     If any biopsies were taken you will be contacted by phone or by letter within the next 1-3 weeks.  Please call us at [phone] if you have not heard about the biopsies in 3 weeks.    SIGNATURES/CONFIDENTIALITY: You and/or your care partner have signed paperwork which will be entered into your electronic medical record.  These signatures attest to the fact that that the information above on your After Visit Summary has been reviewed and is understood.  Full responsibility of the confidentiality of this discharge information lies with you and/or your care-partner.

## 2023-08-26 NOTE — Progress Notes (Signed)
Report to PACU, RN, vss, BBS= Clear.  

## 2023-08-26 NOTE — Progress Notes (Signed)
GASTROENTEROLOGY PROCEDURE H&P NOTE   Primary Care Physician: Cleatis Polka., MD    Reason for Procedure:   Hx of polyps  Plan:    colonoscopy  Patient is appropriate for endoscopic procedure(s) in the ambulatory ([facility]) setting.  The nature of the procedure, as well as the risks, benefits, and alternatives were carefully and thoroughly reviewed with the patient. Ample time for discussion and questions allowed. The patient understood, was satisfied, and agreed to proceed.     HPI: Wendy Black is a 62 y.o. female who presents for surveillance colonoscopy.  Medical history as below.  Tolerated the prep.  No recent chest pain or shortness of breath.  No abdominal pain today.  Past Medical History:  Diagnosis Date   Arthritis    Cancer (HCC)    skin cancer    Heart murmur    Mitral valve prolapse     Past Surgical History:  Procedure Laterality Date   CESAREAN SECTION  1997   COLONOSCOPY  last 09/02/2013   Dr.Brodie    MOHS SURGERY     OOPHORECTOMY  1985   ovarian tumor removal   POLYPECTOMY      Prior to Admission medications   Medication Sig Start Date End Date Taking? Authorizing Provider  ondansetron (ZOFRAN) 4 MG tablet Take 1 tablet (4 mg total) by mouth as directed. Take one Zofran 4 mg tablet 30-60 minutes before each colonoscopy prep dose 08/04/23  Yes Chandlar Staebell, Carie Caddy, MD  Cholecalciferol (VITAMIN D3 PO) Take by mouth.    [provider]  glucosamine-chondroitin 500-400 MG tablet Take 1 tablet by mouth 3 (three) times daily.    [provider]  multivitamin-lutein (OCUVITE-LUTEIN) CAPS capsule Take 1 capsule by mouth daily.    [provider]  Turmeric (QC TUMERIC COMPLEX PO) Take by mouth.    [provider]    Current Outpatient Medications  Medication Sig Dispense Refill   ondansetron (ZOFRAN) 4 MG tablet Take 1 tablet (4 mg total) by mouth as directed. Take one Zofran 4 mg tablet 30-60 minutes before  each colonoscopy prep dose 4 tablet 0   Cholecalciferol (VITAMIN D3 PO) Take by mouth.     glucosamine-chondroitin 500-400 MG tablet Take 1 tablet by mouth 3 (three) times daily.     multivitamin-lutein (OCUVITE-LUTEIN) CAPS capsule Take 1 capsule by mouth daily.     Turmeric (QC TUMERIC COMPLEX PO) Take by mouth.     Current Facility-Administered Medications  Medication Dose Route Frequency Provider Last Rate Last Admin   0.9 %  sodium chloride infusion  500 mL Intravenous Once Chirag Krueger, Carie Caddy, MD        Allergies as of 08/26/2023 - Review Complete 08/26/2023  Allergen Reaction Noted   Penicillins Hives 08/04/2023   Sulfa antibiotics Hives 08/19/2013    Family History  Problem Relation Age of Onset   Stomach cancer Paternal Grandmother    Tongue cancer Paternal Grandmother    Cancer Mother        peritoneal   Colon polyps Mother    Cancer - Other Mother        peritneal cancer    Lung cancer Maternal Grandmother    Colon cancer Neg Hx    Breast cancer Neg Hx    Esophageal cancer Neg Hx    Rectal cancer Neg Hx     Social History   Socioeconomic History   Marital status: Married    Spouse name: Not on file  Number of children: Not on file   Years of education: Not on file   Highest education level: Not on file  Occupational History   Not on file  Tobacco Use   Smoking status: Never   Smokeless tobacco: Never  Vaping Use   Vaping status: Never Used  Substance and Sexual Activity   Alcohol use: Not Currently   Drug use: Not Currently   Sexual activity: Not on file  Other Topics Concern   Not on file  Social History Narrative   Not on file   Social Determinants of Health   Financial Resource Strain: Not on file  Food Insecurity: Not on file  Transportation Needs: Not on file  Physical Activity: Not on file  Stress: Not on file  Social Connections: Unknown (01/28/2022)   Received from [facility], [facility]   Social Network    Social Network: Not on  file  Intimate Partner Violence: Unknown (12/20/2021)   Received from [facility], [facility]   HITS    Physically Hurt: Not on file    Insult or Talk Down To: Not on file    Threaten Physical Harm: Not on file    Scream or Curse: Not on file    Physical Exam: Vital signs in last 24 hours: @BP  (!) 151/87   Pulse 98   Temp 98.2 F (36.8 C) (Temporal)   Ht 5\' 4"  (1.626 m)   Wt 145 lb (65.8 kg)   SpO2 100%   BMI 24.89 kg/m  GEN: NAD EYE: Sclerae anicteric ENT: MMM CV: Non-tachycardic Pulm: CTA b/l GI: Soft, NT/ND NEURO:  Alert & Oriented x 3   Erick Blinks, MD [facility]  08/26/2023 8:39 AM

## 2023-08-26 NOTE — Op Note (Signed)
[facility] Patient Name: Wendy Black Procedure Date: 08/26/2023 8:09 AM MRN: [id number] Endoscopist: Beverley Fiedler , MD, [id number] Age: 62 Referring MD:  Date of Birth: 1961/01/26 Gender: Female Account #: [id number] Procedure:                Colonoscopy Indications:              High risk colon cancer surveillance: Personal                            history of multiple adenomas, Last colonoscopy:                            December 2019 (TA x2), 2014 (TA x1) Medicines:                Monitored Anesthesia Care Procedure:                Pre-Anesthesia Assessment:                           - Prior to the procedure, a History and Physical                            was performed, and patient medications and                            allergies were reviewed. The patient's tolerance of                            previous anesthesia was also reviewed. The risks                            and benefits of the procedure and the sedation                            options and risks were discussed with the patient.                            All questions were answered, and informed consent                            was obtained. Prior Anticoagulants: The patient has                            taken no anticoagulant or antiplatelet agents. ASA                            Grade Assessment: I - A normal, healthy patient.                            After reviewing the risks and benefits, the patient                            was deemed in satisfactory condition to undergo the  procedure.                           After obtaining informed consent, the colonoscope                            was passed under direct vision. Throughout the                            procedure, the patient's blood pressure, pulse, and                            oxygen saturations were monitored continuously. The                            [id number]-HQ190L Colonoscope [id number] was  introduced                            through the anus and advanced to the cecum,                            identified by appendiceal orifice and ileocecal                            valve. The colonoscopy was performed without                            difficulty. The patient tolerated the procedure                            well. The quality of the bowel preparation was                            good. The ileocecal valve, appendiceal orifice, and                            rectum were photographed. Scope In: 8:45:55 AM Scope Out: 9:09:12 AM Scope Withdrawal Time: 0 hours 16 minutes 26 seconds  Total Procedure Duration: 0 hours 23 minutes 17 seconds  Findings:                 The digital rectal exam was normal.                           A 6 mm polyp was found in the cecum. The polyp was                            sessile. The polyp was removed with a cold snare.                            Resection and retrieval were complete.                           A 4 mm polyp was found in the descending colon. The  polyp was sessile. The polyp was removed with a                            cold snare. Resection and retrieval were complete.                            For hemostasis, one hemostatic clip was                            successfully placed (MR conditional). There was no                            bleeding at the end of the maneuver.                           The exam was otherwise without abnormality on                            direct and retroflexion views. Complications:            No immediate complications. Estimated Blood Loss:     Estimated blood loss was minimal. Impression:               - One 6 mm polyp in the cecum, removed with a cold                            snare. Resected and retrieved.                           - One 4 mm polyp in the descending colon, removed                            with a cold snare. Resected and retrieved. Clip (MR                             conditional) was placed.                           - The examination was otherwise normal on direct                            and retroflexion views. Recommendation:           - Patient has a contact number available for                            emergencies. The signs and symptoms of potential                            delayed complications were discussed with the                            patient. Return to normal activities tomorrow.  Written discharge instructions were provided to the                            patient.                           - Resume previous diet.                           - Continue present medications.                           - Await pathology results.                           - Repeat colonoscopy is recommended for                            surveillance. The colonoscopy date will be                            determined after pathology results from today's                            exam become available for review. Beverley Fiedler, MD 08/26/2023 9:12:13 AM This report has been signed electronically.

## 2023-08-26 NOTE — Progress Notes (Signed)
Called to room to assist during endoscopic procedure.  Patient ID and intended procedure confirmed with present staff. Received instructions for my participation in the procedure from the performing physician.  

## 2023-08-27 ENCOUNTER — Telehealth: Payer: Self-pay

## 2023-08-27 NOTE — Telephone Encounter (Signed)
  Follow up Call-     08/26/2023    7:23 AM  Call back number  Post procedure Call Back phone  # [phone]  Permission to leave phone message Yes     Patient questions:  Do you have a fever, pain , or abdominal swelling? No. Pain Score  0 *  Have you tolerated food without any problems? Yes.    Have you been able to return to your normal activities? Yes.    Do you have any questions about your discharge instructions: Diet   No. Medications  No. Follow up visit  No.  Do you have questions or concerns about your Care? No.  Actions: * If pain score is 4 or above: No action needed, pain <4.

## 2023-08-28 LAB — SURGICAL PATHOLOGY

## 2023-08-31 ENCOUNTER — Encounter: Payer: Self-pay | Admitting: Internal Medicine

## 2023-11-23 ENCOUNTER — Other Ambulatory Visit: Payer: Self-pay | Admitting: Internal Medicine

## 2023-11-23 DIAGNOSIS — Z0000 Encounter for general adult medical examination without abnormal findings: Secondary | ICD-10-CM

## 2023-11-27 ENCOUNTER — Ambulatory Visit
Admission: RE | Admit: 2023-11-27 | Discharge: 2023-11-27 | Disposition: A | Discharge status: Home / Self Care | Payer: Self-pay | Source: Ambulatory Visit | Attending: Internal Medicine | Admitting: Internal Medicine

## 2023-11-27 DIAGNOSIS — Z0000 Encounter for general adult medical examination without abnormal findings: Secondary | ICD-10-CM

## 2024-03-10 ENCOUNTER — Other Ambulatory Visit: Payer: Self-pay | Admitting: Urology

## 2024-03-14 ENCOUNTER — Encounter (HOSPITAL_COMMUNITY): Payer: Self-pay | Admitting: Urology

## 2024-03-14 NOTE — Progress Notes (Signed)
 Spoke w/ via phone for pre-op interview---[facility] needs dos---- KUB              COVID test -----patient states asymptomatic no test needed Arrive at -------0600 NPO after MN NO Solid Food.  Clear liquids from MN until--- Med rec completed. Pt aware to hold ASA/NSAIDs and supplements per [facility] protocol.  Medications to take morning of surgery -----none Diabetic/Weight loss medication ----- No Alcohol or recreational drugs for 24 hours/Tobacco products for 6 hours ---- Patient instructed to bring blue lithotripsy folder, photo id and insurance card day of surgery. Patient aware to have Driver (ride ) / caregiver for 24 hours after surgery -----[place] [phone] Patient Special Instructions -----bring blue folder, wear comfortable clothes without metal belt buckles, buttons, or zippers. Solid shoes, no sandalsflip flop, crocs, or clogs. Pre-Op special Instructions ----- take laxative of choice day before procedure Patient verbalized understanding of instructions that were given at this phone interview. Patient denies shortness of breath, chest pain, fever, cough at this phone interview.

## 2024-03-21 ENCOUNTER — Ambulatory Visit (HOSPITAL_COMMUNITY)

## 2024-03-21 ENCOUNTER — Encounter (HOSPITAL_COMMUNITY): Payer: Self-pay | Admitting: Urology

## 2024-03-21 ENCOUNTER — Other Ambulatory Visit: Payer: Self-pay

## 2024-03-21 ENCOUNTER — Encounter (HOSPITAL_COMMUNITY)
Admission: RE | Disposition: A | Discharge status: Home / Self Care | Payer: Self-pay | Source: Home / Self Care | Attending: Urology

## 2024-03-21 ENCOUNTER — Ambulatory Visit (HOSPITAL_COMMUNITY)
Admission: RE | Admit: 2024-03-21 | Discharge: 2024-03-21 | Disposition: A | Discharge status: Home / Self Care | Attending: Urology | Admitting: Urology

## 2024-03-21 DIAGNOSIS — Z79899 Other long term (current) drug therapy: Secondary | ICD-10-CM | POA: Diagnosis not present

## 2024-03-21 DIAGNOSIS — N200 Calculus of kidney: Secondary | ICD-10-CM | POA: Insufficient documentation

## 2024-03-21 DIAGNOSIS — M47816 Spondylosis without myelopathy or radiculopathy, lumbar region: Secondary | ICD-10-CM | POA: Diagnosis not present

## 2024-03-21 SURGERY — LITHOTRIPSY, ESWL
Anesthesia: LOCAL | Laterality: Left

## 2024-03-21 MED ORDER — CIPROFLOXACIN HCL 500 MG PO TABS
500.0000 mg | ORAL_TABLET | ORAL | Status: AC
  Administered 2024-03-21: 500 mg via ORAL
  Filled 2024-03-21: qty 1

## 2024-03-21 MED ORDER — DIPHENHYDRAMINE HCL 25 MG PO CAPS
25.0000 mg | ORAL_CAPSULE | ORAL | Status: AC
  Administered 2024-03-21: 25 mg via ORAL
  Filled 2024-03-21: qty 1

## 2024-03-21 MED ORDER — SODIUM CHLORIDE 0.9 % IV SOLN: INTRAVENOUS | Status: DC

## 2024-03-21 MED ORDER — TRAMADOL HCL 50 MG PO TABS: 50.0000 mg | ORAL_TABLET | Freq: Four times a day (QID) | ORAL | 0 refills | Status: AC | PRN

## 2024-03-21 MED ORDER — DIAZEPAM 5 MG PO TABS
5.0000 mg | ORAL_TABLET | Freq: Once | ORAL | Status: AC
  Administered 2024-03-21: 5 mg via ORAL

## 2024-03-21 MED ORDER — DIAZEPAM 5 MG PO TABS
10.0000 mg | ORAL_TABLET | ORAL | Status: DC
  Filled 2024-03-21: qty 2

## 2024-03-21 MED ORDER — BISACODYL 5 MG PO TBEC: 5.0000 mg | DELAYED_RELEASE_TABLET | Freq: Every day | ORAL | Status: DC | PRN

## 2024-03-21 MED ORDER — ONDANSETRON HCL 4 MG PO TABS: 4.0000 mg | ORAL_TABLET | Freq: Every day | ORAL | 1 refills | Status: AC | PRN

## 2024-03-21 NOTE — Op Note (Signed)
 ESWL Operative Note  Treating Physician: Lonni Han, MD  Pre-op diagnosis: 6 mm left renal stone  Post-op diagnosis: Same   Procedure: LEFT ESWL   See Norita Dustman OP note scanned into chart. Also because of the size, density, location and other factors that cannot be anticipated I feel this will likely be a staged procedure. This fact supersedes any indication in the scanned [place] [facility] operative note to the contrary

## 2024-03-21 NOTE — H&P (Signed)
See scanned [facility] documents for H&P.   

## 2024-03-22 ENCOUNTER — Encounter (HOSPITAL_COMMUNITY): Payer: Self-pay | Admitting: Urology
